# Patient Record
Sex: Male | Born: 1995
Health system: Southern US, Community
[De-identification: ages and names within clinical notes are randomized; demographics above are authoritative.]

## PROBLEM LIST (undated history)

## (undated) HISTORY — PX: APPENDECTOMY: SHX54

## (undated) HISTORY — PX: OTHER SURGICAL HISTORY: SHX169

---
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
## 2016-08-24 ENCOUNTER — Ambulatory Visit: Admission: EM | Admit: 2016-08-24 | Discharge: 2016-08-24 | Discharge status: Absent without leave | Payer: 59

## 2017-06-07 DIAGNOSIS — B86 Scabies: Secondary | ICD-10-CM | POA: Diagnosis not present

## 2017-07-06 DIAGNOSIS — B86 Scabies: Secondary | ICD-10-CM | POA: Diagnosis not present

## 2017-07-13 DIAGNOSIS — H6981 Other specified disorders of Eustachian tube, right ear: Secondary | ICD-10-CM | POA: Diagnosis not present

## 2017-10-07 DIAGNOSIS — R202 Paresthesia of skin: Secondary | ICD-10-CM | POA: Diagnosis not present

## 2017-12-22 ENCOUNTER — Other Ambulatory Visit: Payer: Self-pay | Admitting: Internal Medicine

## 2017-12-22 DIAGNOSIS — R519 Headache, unspecified: Secondary | ICD-10-CM

## 2017-12-22 DIAGNOSIS — R51 Headache: Principal | ICD-10-CM

## 2017-12-23 ENCOUNTER — Other Ambulatory Visit: Payer: Self-pay | Admitting: Internal Medicine

## 2017-12-23 DIAGNOSIS — R51 Headache: Principal | ICD-10-CM

## 2017-12-23 DIAGNOSIS — R519 Headache, unspecified: Secondary | ICD-10-CM

## 2018-01-05 ENCOUNTER — Ambulatory Visit (HOSPITAL_COMMUNITY)
Admission: RE | Admit: 2018-01-05 | Discharge: 2018-01-05 | Disposition: A | Discharge status: Home / Self Care | Payer: 59 | Source: Ambulatory Visit | Attending: Internal Medicine | Admitting: Internal Medicine

## 2018-01-05 DIAGNOSIS — R519 Headache, unspecified: Secondary | ICD-10-CM

## 2018-01-05 DIAGNOSIS — R51 Headache: Secondary | ICD-10-CM | POA: Insufficient documentation

## 2018-01-05 MED ORDER — GADOBENATE DIMEGLUMINE 529 MG/ML IV SOLN
20.0000 mL | Freq: Once | INTRAVENOUS | Status: AC | PRN
  Administered 2018-01-05: 20 mL via INTRAVENOUS

## 2018-01-07 ENCOUNTER — Ambulatory Visit: Payer: 59

## 2018-01-07 ENCOUNTER — Ambulatory Visit (HOSPITAL_COMMUNITY): Payer: 59

## 2018-01-21 DIAGNOSIS — G44221 Chronic tension-type headache, intractable: Secondary | ICD-10-CM | POA: Diagnosis not present

## 2018-11-07 DIAGNOSIS — R030 Elevated blood-pressure reading, without diagnosis of hypertension: Secondary | ICD-10-CM | POA: Diagnosis not present

## 2018-11-07 DIAGNOSIS — J0110 Acute frontal sinusitis, unspecified: Secondary | ICD-10-CM | POA: Diagnosis not present

## 2018-11-29 DIAGNOSIS — J310 Chronic rhinitis: Secondary | ICD-10-CM | POA: Diagnosis not present

## 2018-11-29 DIAGNOSIS — R05 Cough: Secondary | ICD-10-CM | POA: Diagnosis not present

## 2018-11-29 DIAGNOSIS — J343 Hypertrophy of nasal turbinates: Secondary | ICD-10-CM | POA: Diagnosis not present

## 2018-11-30 DIAGNOSIS — R05 Cough: Secondary | ICD-10-CM | POA: Diagnosis not present

## 2018-12-09 DIAGNOSIS — Z0182 Encounter for allergy testing: Secondary | ICD-10-CM | POA: Diagnosis not present

## 2018-12-09 DIAGNOSIS — J301 Allergic rhinitis due to pollen: Secondary | ICD-10-CM | POA: Diagnosis not present

## 2018-12-09 DIAGNOSIS — J3081 Allergic rhinitis due to animal (cat) (dog) hair and dander: Secondary | ICD-10-CM | POA: Diagnosis not present

## 2018-12-09 DIAGNOSIS — J3089 Other allergic rhinitis: Secondary | ICD-10-CM | POA: Diagnosis not present

## 2020-04-28 ENCOUNTER — Emergency Department
Admission: EM | Admit: 2020-04-28 | Discharge: 2020-04-28 | Disposition: A | Discharge status: Home / Self Care | Payer: BC Managed Care – PPO | Attending: Emergency Medicine | Admitting: Emergency Medicine

## 2020-04-28 ENCOUNTER — Emergency Department: Payer: BC Managed Care – PPO

## 2020-04-28 ENCOUNTER — Other Ambulatory Visit: Payer: Self-pay

## 2020-04-28 DIAGNOSIS — Y929 Unspecified place or not applicable: Secondary | ICD-10-CM | POA: Diagnosis not present

## 2020-04-28 DIAGNOSIS — Y999 Unspecified external cause status: Secondary | ICD-10-CM | POA: Insufficient documentation

## 2020-04-28 DIAGNOSIS — S93402A Sprain of unspecified ligament of left ankle, initial encounter: Secondary | ICD-10-CM

## 2020-04-28 DIAGNOSIS — Y9339 Activity, other involving climbing, rappelling and jumping off: Secondary | ICD-10-CM | POA: Diagnosis not present

## 2020-04-28 DIAGNOSIS — S99912A Unspecified injury of left ankle, initial encounter: Secondary | ICD-10-CM | POA: Diagnosis present

## 2020-04-28 DIAGNOSIS — X500XXA Overexertion from strenuous movement or load, initial encounter: Secondary | ICD-10-CM | POA: Diagnosis not present

## 2020-04-28 DIAGNOSIS — R609 Edema, unspecified: Secondary | ICD-10-CM

## 2020-04-28 NOTE — Discharge Instructions (Signed)
You were seen today for left ankle pain and swelling.  Your x-rays were negative.  Your pain and swelling are being caused by a sprain.  We have placed you in an Aircast, wear during the day when up and ambulating.  You can take this off at night.  I encourage rest, ice, elevation and Tylenol OTC.

## 2020-04-28 NOTE — ED Notes (Signed)
Pt verbalized understanding of discharge instructions. NAD at this time. 

## 2020-04-28 NOTE — ED Notes (Signed)
Pt with c/o of left ankle pain after "rolling" his ankle. Significant swelling noted to left ankle. Pt ambulatory with difficulty to ED Flex room.

## 2020-04-28 NOTE — ED Provider Notes (Signed)
[facility] Emergency Department Provider Note ____________________________________________  Time seen: 0815  I have reviewed the triage vital signs and the nursing notes.  HISTORY  Chief Complaint  Ankle Pain   HPI Lawrence Martinez is a 24 y.o. male presents to the ER today with complaint of left ankle pain and swelling. He reports Friday evening, he jumped up in the air and landed wrong on his left lower extremity. He reports hearing a pop and noticed immediate swelling. He describes the pain as sore and achy, worse with weightbearing. He denies numbness, tingling but has noticed some weakness in the left lower extremity. He has noticed bruising as well. He has been able to weight-bear but reports it is painful. He has not taken any meds OTC for this but has been putting ice on it.  History reviewed. No pertinent past medical history.  There are no problems to display for this patient.   Past Surgical History:  Procedure Laterality Date  . APPENDECTOMY    . kidney stent      Prior to Admission medications   Not on File    Allergies Ibuprofen  History reviewed. No pertinent family history.  Social History Social History   Tobacco Use  . Smoking status: Never Smoker  Substance Use Topics  . Alcohol use: Yes  . Drug use: Not on file    Review of Systems  Constitutional: Negative for fever, chills or body aches. Cardiovascular: Negative for chest pain or chest tightness. Respiratory: Negative for cough or shortness of breath. Musculoskeletal: Positive for left ankle pain and swelling, difficulty with gait. Negative for knee or hip pain. Skin: Positive for bruising of the left foot. Neurological: Positive for weakness of the LLE. Negative for tingling or numbness. ____________________________________________  PHYSICAL EXAM:  VITAL SIGNS: ED Triage Vitals  Enc Vitals Group     BP 04/28/20 0742 (!) 144/85     Pulse Rate 04/28/20 0742 89      Resp 04/28/20 0742 16     Temp 04/28/20 0742 97.7 F (36.5 C)     Temp Source 04/28/20 0742 Oral     SpO2 04/28/20 0742 100 %     Weight 04/28/20 0743 235 lb (106.6 kg)     Height 04/28/20 0743 6\' 2"  (1.88 m)     Head Circumference --      Peak Flow --      Pain Score 04/28/20 0742 6     Pain Loc --      Pain Edu? --      Excl. in [place]? --     Constitutional: Alert and oriented. Well appearing and in no distress. Cardiovascular: Normal rate, regular rhythm. Pedal pulses 2+ Respiratory: Normal respiratory effort. No wheezes/rales/rhonchi. Musculoskeletal: Decreased flexion, extension and rotation of left ankle secondary to pain and swelling. Pain with palpation over the lateral fifth metatarsal and medial first metatarsal. 1+ swelling noted of the left foot and ankle. Limping gait. Neurologic: Normal speech and language. No gross focal neurologic deficits are appreciated. Skin:  Skin is warm, bruising noted over the third through fifth metatarsals. ____________________________________________   RADIOLOGY  Imaging Orders     DG Foot Complete Left     DG Ankle Complete Left IMPRESSION: Negative for acute bony abnormality.  Nonspecific soft tissue swelling on the dorsal forefoot  IMPRESSION: Negative for acute bony abnormality.  Soft tissue swelling on the lateral ankle.   ____________________________________________    INITIAL IMPRESSION / ASSESSMENT AND PLAN / ED  COURSE  Left Ankle Pain and Swelling:  Likely a sprain Xray left foot negative Xray left ankle negative Ankle air cast applied Encouraged RICE therapy Work note provided ___________________________  FINAL CLINICAL IMPRESSION(S) / ED DIAGNOSES  Final Diagnosis:  Left Ankle Sprain   Lorre Munroe, NP 04/28/20 [id number]    Emily Filbert, MD 04/28/20 0900

## 2020-04-28 NOTE — ED Triage Notes (Signed)
Pt c/o L ankle pain since Friday. Swollen. Went to work yesterday. States rolled ankle and heard it pop. A&O, ambulatory. Refused wheelchair. Been icing it, no meds for it.

## 2020-05-29 ENCOUNTER — Encounter: Payer: Self-pay | Admitting: Family Medicine

## 2021-02-19 IMAGING — DX DG FOOT COMPLETE 3+V*L*
3 series · 3 of 3 positions shown · non-contrast
Comparison: None.

CLINICAL DATA: 23-year-old male with a history of left foot and
ankle pain for 2 days

EXAM:
LEFT FOOT - COMPLETE 3+ VIEW

[foot ap]
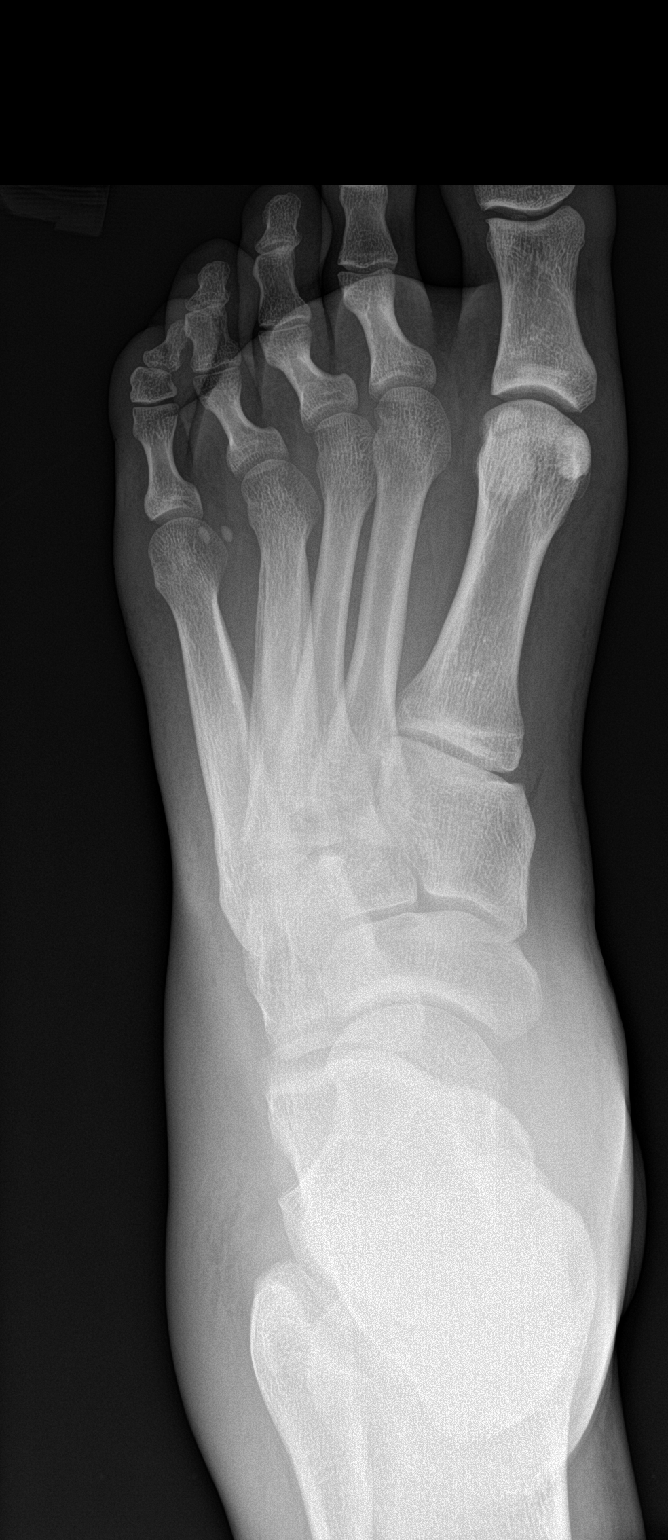

[foot obl]
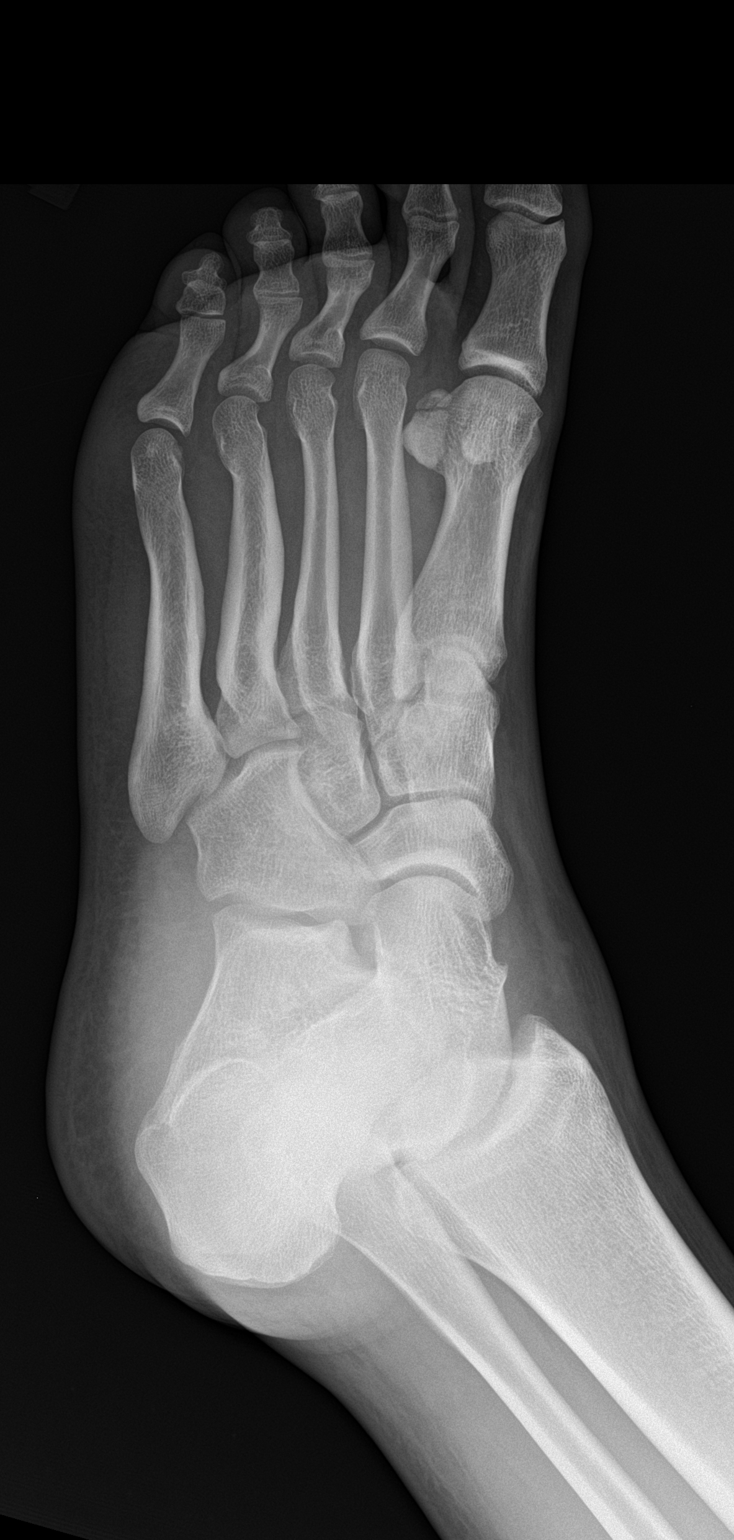

[foot lat]
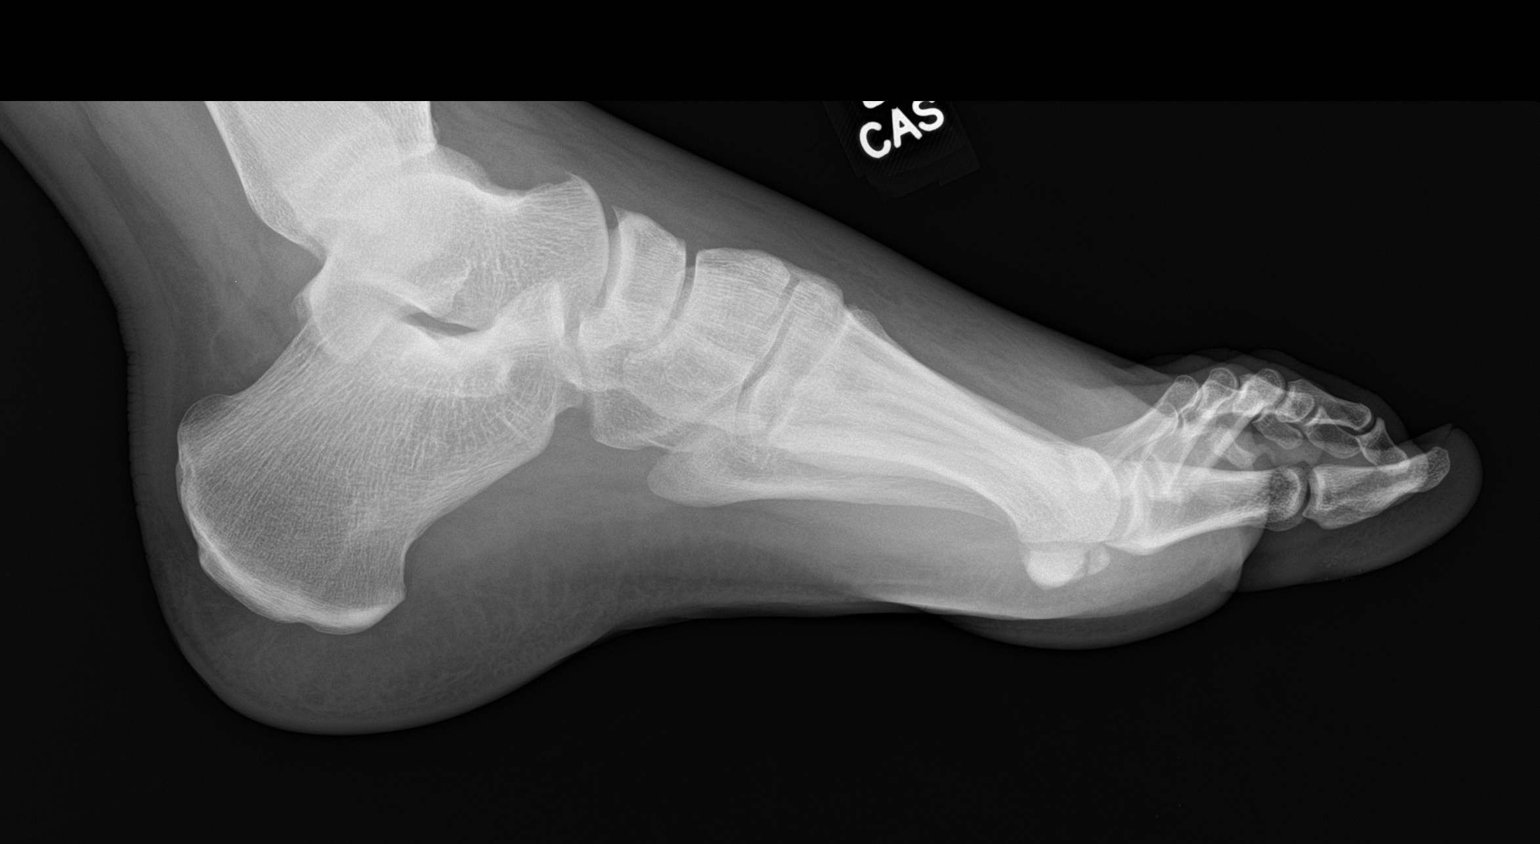

[3 of 3 positions shown; findings below may reference images not displayed]

FINDINGS: No acute displaced fracture. No radiopaque foreign body. No
significant degenerative changes. Lateral view of the foot
demonstrates soft tissue swelling on the dorsal forefoot.
IMPRESSION: Negative for acute bony abnormality.

Nonspecific soft tissue swelling on the dorsal forefoot
